# Patient Record
Sex: Female | Born: 1980
Health system: Southern US, Community
[De-identification: ages and names within clinical notes are randomized; demographics above are authoritative.]

## PROBLEM LIST (undated history)

## (undated) DIAGNOSIS — K219 Gastro-esophageal reflux disease without esophagitis: Secondary | ICD-10-CM

## (undated) HISTORY — DX: Gastro-esophageal reflux disease without esophagitis: K21.9

## (undated) HISTORY — PX: WISDOM TOOTH EXTRACTION: SHX21

---
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
## 2002-09-11 ENCOUNTER — Emergency Department (HOSPITAL_COMMUNITY): Admission: EM | Admit: 2002-09-11 | Discharge: 2002-09-11 | Payer: Self-pay | Admitting: Emergency Medicine

## 2002-09-30 ENCOUNTER — Emergency Department (HOSPITAL_COMMUNITY): Admission: EM | Admit: 2002-09-30 | Discharge: 2002-09-30 | Payer: Self-pay | Admitting: Emergency Medicine

## 2003-05-16 ENCOUNTER — Emergency Department (HOSPITAL_COMMUNITY): Admission: EM | Admit: 2003-05-16 | Discharge: 2003-05-16 | Payer: Self-pay | Admitting: Emergency Medicine

## 2003-08-02 ENCOUNTER — Ambulatory Visit (HOSPITAL_COMMUNITY): Admission: RE | Admit: 2003-08-02 | Discharge: 2003-08-02 | Payer: Self-pay | Admitting: Obstetrics

## 2003-08-11 ENCOUNTER — Emergency Department (HOSPITAL_COMMUNITY): Admission: EM | Admit: 2003-08-11 | Discharge: 2003-08-11 | Payer: Self-pay | Admitting: Emergency Medicine

## 2003-08-18 ENCOUNTER — Inpatient Hospital Stay (HOSPITAL_COMMUNITY): Admission: AD | Admit: 2003-08-18 | Discharge: 2003-08-18 | Payer: Self-pay | Admitting: Obstetrics

## 2003-08-19 ENCOUNTER — Encounter (INDEPENDENT_AMBULATORY_CARE_PROVIDER_SITE_OTHER): Payer: Self-pay | Admitting: Specialist

## 2003-08-19 ENCOUNTER — Ambulatory Visit (HOSPITAL_COMMUNITY): Admission: RE | Admit: 2003-08-19 | Discharge: 2003-08-19 | Payer: Self-pay | Admitting: Obstetrics

## 2003-11-02 ENCOUNTER — Emergency Department (HOSPITAL_COMMUNITY): Admission: EM | Admit: 2003-11-02 | Discharge: 2003-11-02 | Payer: Self-pay | Admitting: Emergency Medicine

## 2003-12-15 ENCOUNTER — Inpatient Hospital Stay (HOSPITAL_COMMUNITY): Admission: AD | Admit: 2003-12-15 | Discharge: 2003-12-15 | Payer: Self-pay | Admitting: Obstetrics

## 2004-07-15 HISTORY — PX: TUBAL LIGATION: SHX77

## 2004-12-12 ENCOUNTER — Emergency Department (HOSPITAL_COMMUNITY): Admission: EM | Admit: 2004-12-12 | Discharge: 2004-12-12 | Payer: Self-pay | Admitting: Family Medicine

## 2005-09-30 IMAGING — US US OB COMP LESS 14 WK
1 series · 18 of 24 positions shown · non-contrast
Comparison: none

CLINICAL DATA: Size less than dates.
EARLY OBSTETRICAL ULTRASOUND:
Transabdominal scanning demonstrates an intrauterine gestational sac with a mean sac diameter of 5.4 cm which would correspond to a gestational age of 10 weeks 2 days.  There is a small echogenic focus along one wall of the sac, but this is not a discrete or identifiable embryo.  There is no identifiable yolk sac.  
A 1.5 x 0.7 cm hypoechoic focus is identified adjacent to the gestational sac which may represent a small focus of subchorionic hemorrhage.  
Neither ovary could be identified.  There is no evidence for free fluid in the cul-de-sac.
IMPRESSION
No evidence for a living intrauterine pregnancy.  There is a small echogenic focus along one wall of the gestational sac which may represent a remnant of the embryo.  No yolk sac or amnion is identified.  
Probable small subchorionic hemorrhage.  
Recommend correlation with beta hCG levels.

[Series 1: us ob comp<14 wk · 18 of 24 slices shown]
[im 1/24]
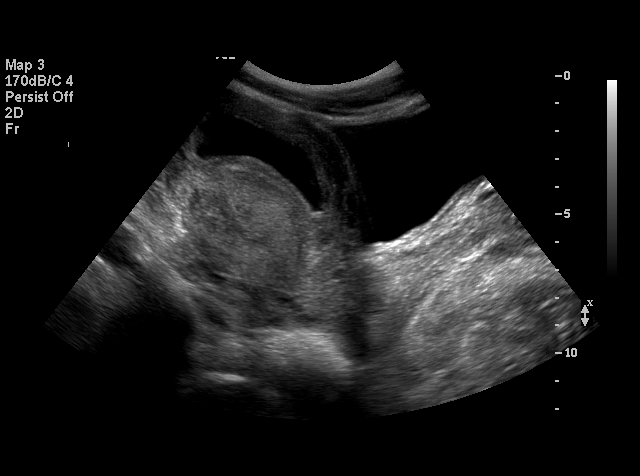
[im 3/24]
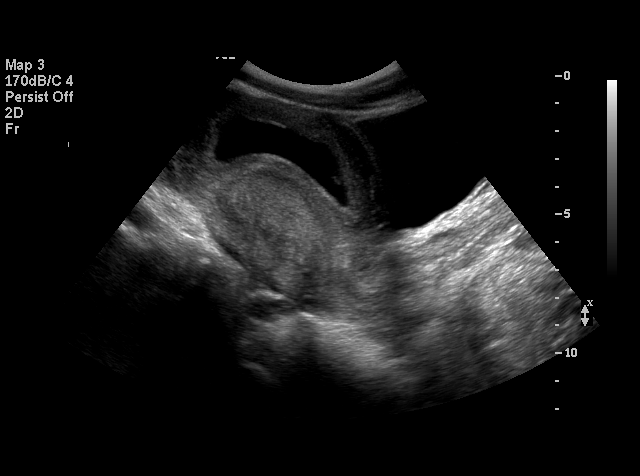
[im 4/24]
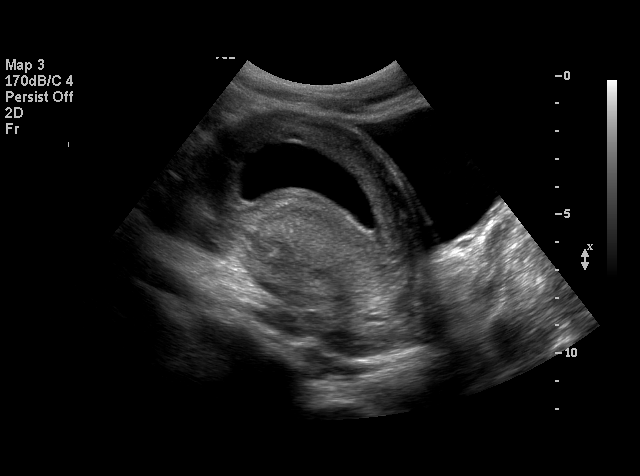
[im 5/24]
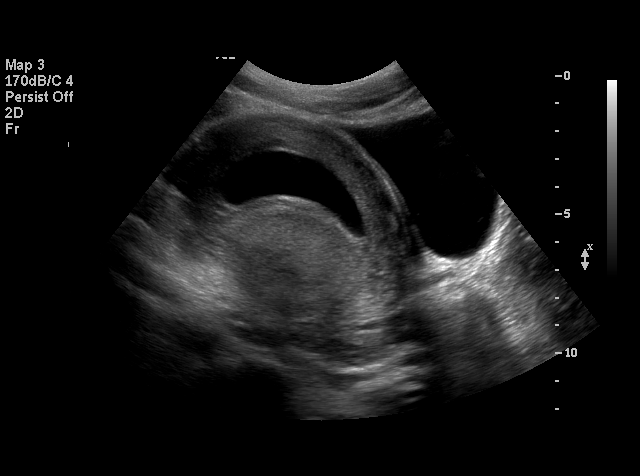
[im 7/24]
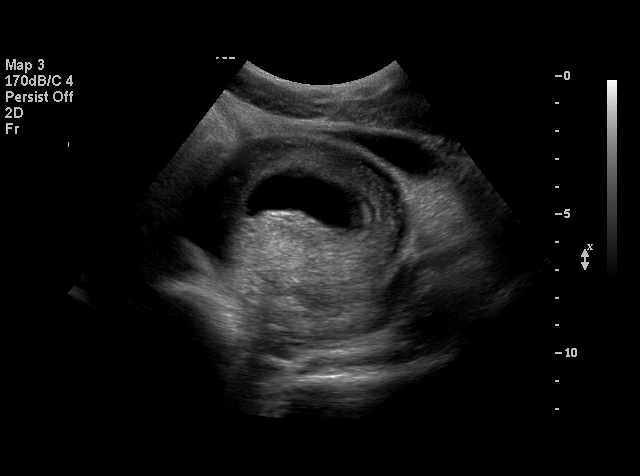
[im 8/24]
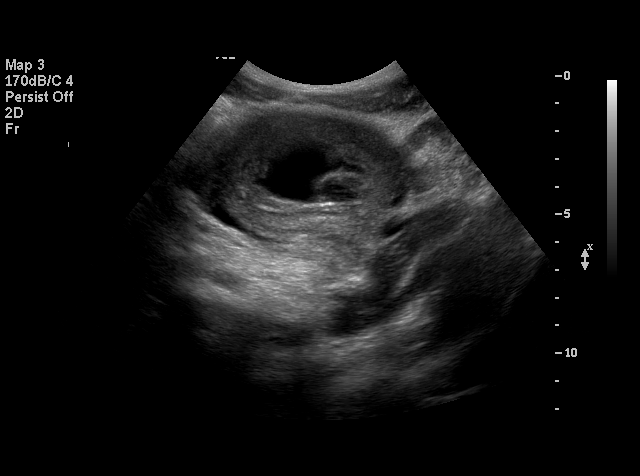
[im 9/24]
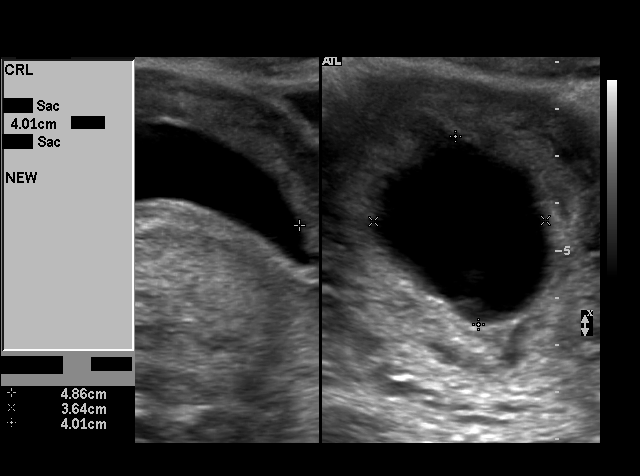
[im 11/24]
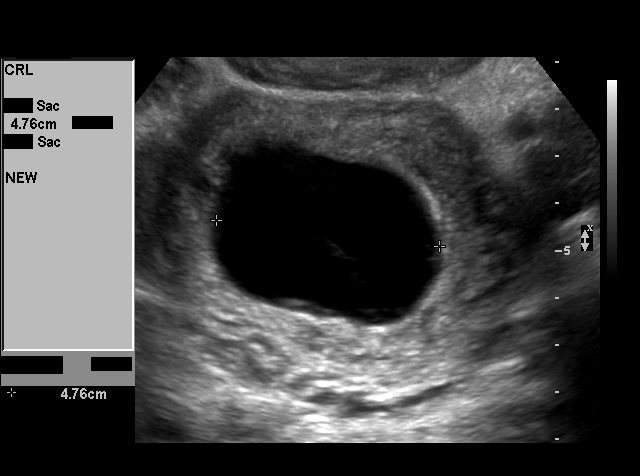
[im 12/24]
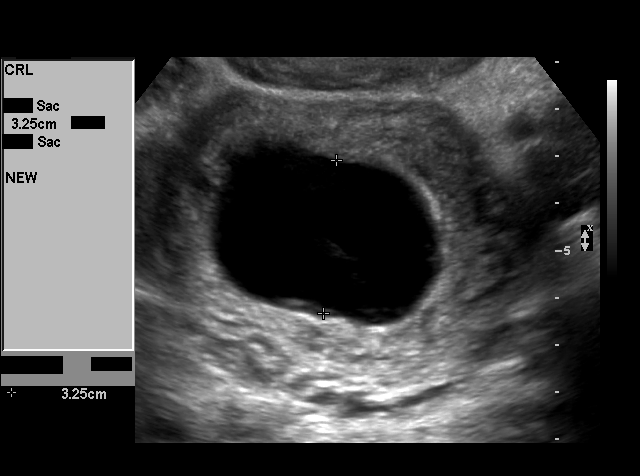
[im 13/24]
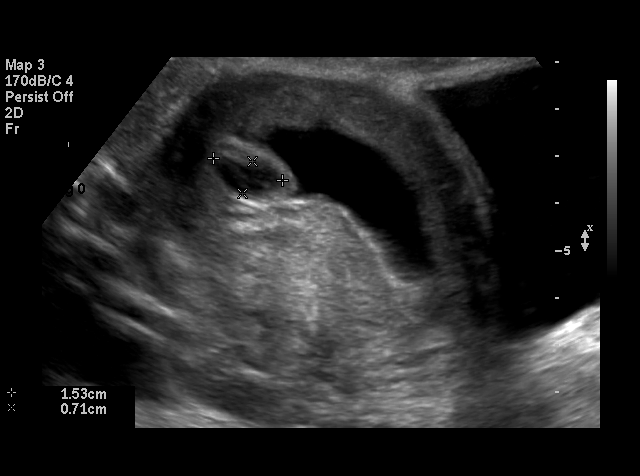
[im 15/24]
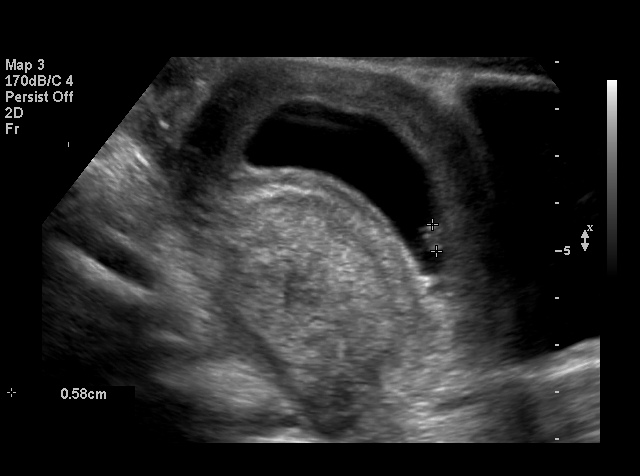
[im 16/24]
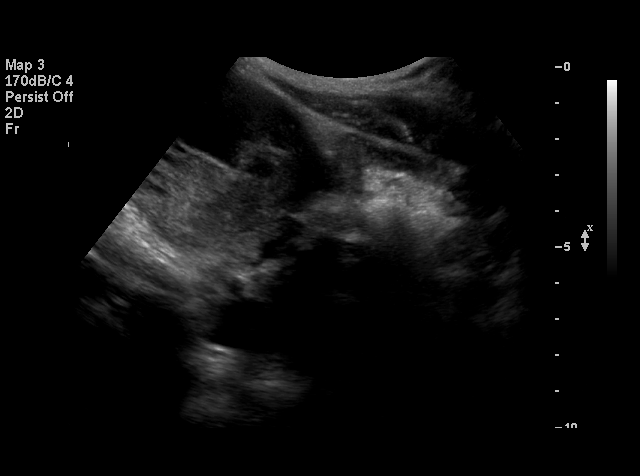
[im 17/24]
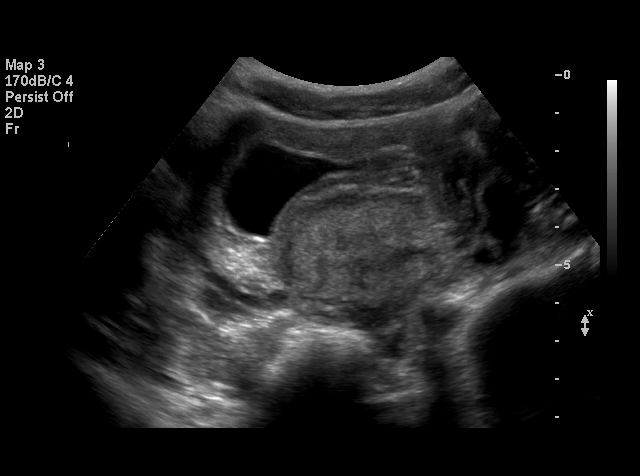
[im 19/24]
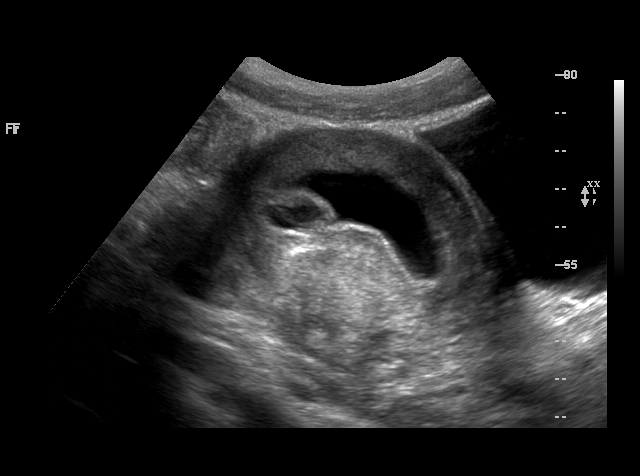
[im 20/24]
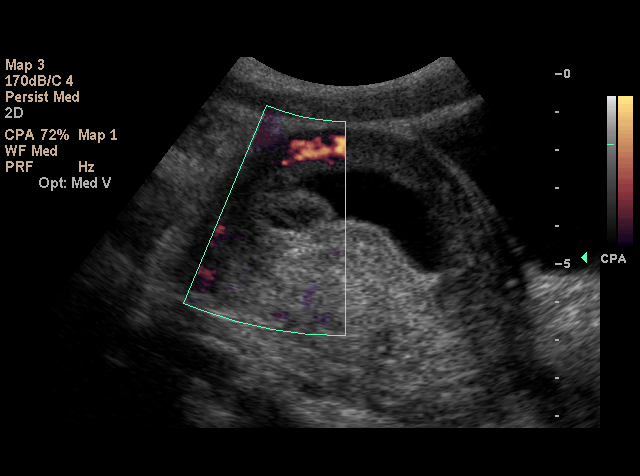
[im 21/24]
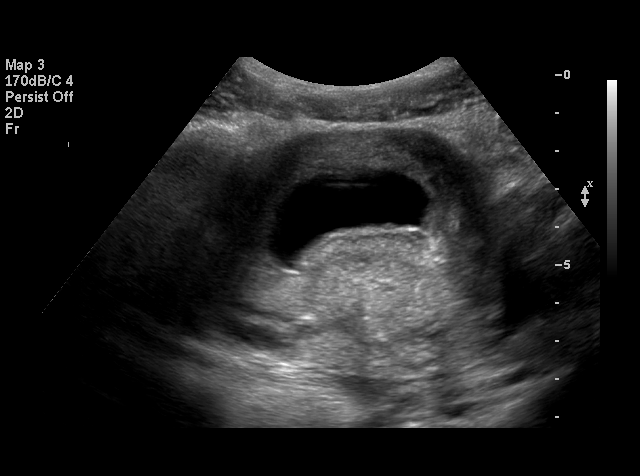
[im 23/24]
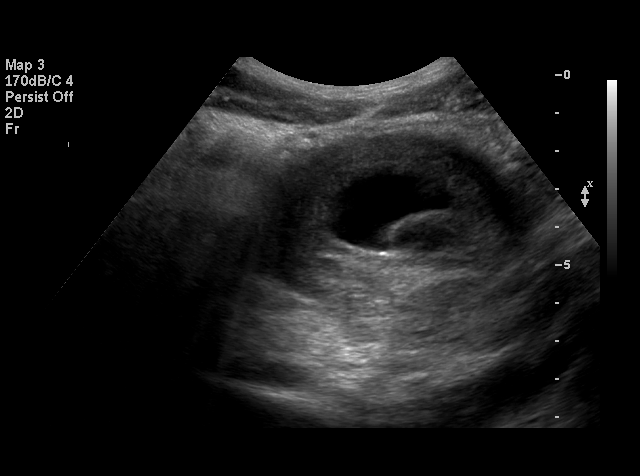
[im 24/24]
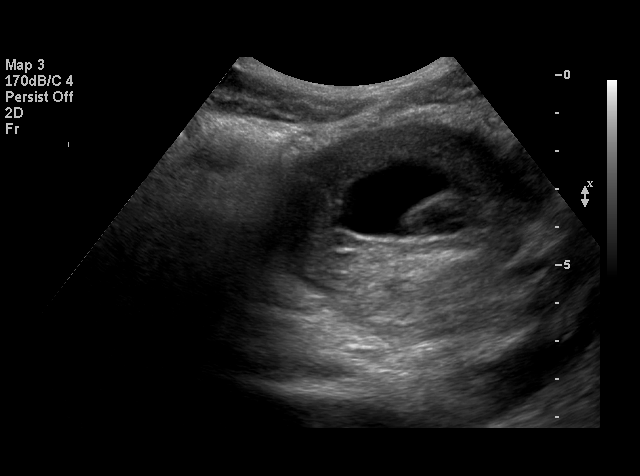

[18 of 24 positions shown; findings below may reference images not displayed]

## 2010-02-12 ENCOUNTER — Emergency Department (HOSPITAL_COMMUNITY)
Admission: EM | Admit: 2010-02-12 | Discharge: 2010-02-13 | Payer: Self-pay | Source: Home / Self Care | Admitting: Emergency Medicine

## 2010-08-02 NOTE — Op Note (Signed)
NAME:  Briana Stout, Briana Stout                         ACCOUNT NO.:  [id number]   MEDICAL RECORD NO.:  [id number]                   PATIENT TYPE:  AMB   LOCATION:  [facility]                                  FACILITY:  [facility]   PHYSICIAN:  Kathreen Cosier, M.D.           DATE OF BIRTH:  [birth year]   DATE OF PROCEDURE:  08/19/2003  DATE OF DISCHARGE:                                 OPERATIVE REPORT   PREOPERATIVE DIAGNOSES:  Intrauterine fetal demise at 11 weeks.   ANESTHESIA:  MAC.   SURGEON:  Kathreen Cosier, M.D.   DESCRIPTION OF PROCEDURE:  The patient placed on the operating room table in  the lithotomy position, after MAC was administered.  Speculum placed in the  vagina.  The anterior lip of the cervix grasped with the tenaculum.  The  cervix was injected with 10 cc of 1% Xylocaine at 3, 9 and 12 o'clock.  The  os was noted to be opened.  The uterus was 10-12 week size.   The cervix easily admitted a #10 suction.  The contents of the uterus were  aspirated and suctioned until the cavity was clean.   The patient tolerated the procedure well and was taken to the recovery room  in good condition.                                               Kathreen Cosier, M.D.    BAM/MEDQ  D:  08/19/2003  T:  08/19/2003  Job:  [id number]

## 2012-05-11 ENCOUNTER — Emergency Department (HOSPITAL_COMMUNITY)
Admission: EM | Admit: 2012-05-11 | Discharge: 2012-05-12 | Disposition: A | Discharge status: Home / Self Care | Payer: Self-pay | Attending: Emergency Medicine | Admitting: Emergency Medicine

## 2012-05-11 ENCOUNTER — Encounter (HOSPITAL_COMMUNITY): Payer: Self-pay

## 2012-05-11 DIAGNOSIS — F17200 Nicotine dependence, unspecified, uncomplicated: Secondary | ICD-10-CM | POA: Insufficient documentation

## 2012-05-11 DIAGNOSIS — K130 Diseases of lips: Secondary | ICD-10-CM | POA: Insufficient documentation

## 2012-05-11 NOTE — ED Notes (Addendum)
Patient presents with c/o a burning sensation on tongue and mouth pain x 3 weeks. Gradually getting worse and more uncomfortable. Concerned about thrush. States that she has "little white bumps and patches" in her mouth. Patient smokes about 1/2 pack of cigarettes/day. Denies smokeless tobacco or chew. Denies any fevers, chills, sweats. No recent illness.

## 2012-05-12 MED ORDER — MAGIC MOUTHWASH
5.0000 mL | Freq: Once | ORAL | Status: AC
  Administered 2012-05-12: 5 mL via ORAL
  Filled 2012-05-12: qty 5

## 2012-05-12 MED ORDER — WHITE PETROLATUM EX GEL: CUTANEOUS | Status: DC | PRN

## 2012-05-12 NOTE — ED Provider Notes (Signed)
History     CSN: [id number]  Arrival date & time 05/11/12  2326   None     Chief Complaint  Patient presents with  . Mouth Lesions    (Consider location/radiation/quality/duration/timing/severity/associated sxs/prior treatment) HPI History provided by pt.   Healthy 31yo F presents w/ 3 weeks of burning pain of her tongue, that occurs with po intake.  Yesterday she noticed white bumps on her tongue.  1 week ago, she developed painful cracks at corners of mouth, left worse than right.  Has been applying carmex w/out relief.  Denies fever and sore throat.  No known allergens or new contacts.  History reviewed. No pertinent past medical history.  Past Surgical History  Procedure Laterality Date  . Tubal ligation Bilateral 07/2004    No family history on file.  History  Substance Use Topics  . Smoking status: Current Every Day Smoker -- 0.50 packs/day    Types: Cigarettes  . Smokeless tobacco: Never Used  . Alcohol Use: Yes    OB History   Grav Para Term Preterm Abortions TAB SAB Ect Mult Living                  Review of Systems  All other systems reviewed and are negative.    Allergies  Review of patient's allergies indicates no known allergies.  Home Medications   Current Outpatient Rx  Name  Route  Sig  Dispense  Refill  . ibuprofen (ADVIL,MOTRIN) 200 MG tablet   Oral   Take 200-800 mg by mouth every 6 (six) hours as needed for pain.         . white petrolatum ointment   Topical   Apply topically as needed for dry skin.   30 g   0     BP 139/88  Pulse 57  Temp(Src) 97.5 F (36.4 C) (Oral)  Resp 16  Ht 5\' 7"  (1.702 m)  Wt 163 lb (73.936 kg)  BMI 25.52 kg/m2  SpO2 100%  LMP 05/08/2012  Physical Exam  Nursing note and vitals reviewed. Constitutional: She is oriented to person, place, and time. She appears well-developed and well-nourished. No distress.  HENT:  Head: Normocephalic and atraumatic.  Mouth/Throat: Oropharynx is clear and  moist. No oropharyngeal exudate.  Tongue with tiny, discrete, skin-colored papules.  No erythema, drainage or tenderness.  No other lesions of buccal mucosa.  Mild, diffuse gingivitis.  Dental caries.  Fissures corners of mouth, left worse than right.    Eyes:  Normal appearance  Neck: Normal range of motion.  Pulmonary/Chest: Effort normal.  Musculoskeletal: Normal range of motion.  Lymphadenopathy:    She has no cervical adenopathy.  Neurological: She is alert and oriented to person, place, and time.  Psychiatric: She has a normal mood and affect. Her behavior is normal.    ED Course  Procedures (including critical care time)  Labs Reviewed - No data to display No results found.   1. Angular cheilitis       MDM  Healthy 31yo F presents w/ burning sensation of tongue x 3 weeks w/ papular tongue lesions x 1d as well as angular cheilitis.  Pt received miracle mouthwash in ED and was prescribed petroleum ointment for fissures.  Do not suspect bacterial/fungal infection or STD.  Referred to ENT for further evaluation.  Return precautions discussed.         Otilio Miu, PA-C 05/12/12 [phone]

## 2012-05-13 NOTE — ED Provider Notes (Signed)
Medical screening examination/treatment/procedure(s) were performed by non-physician practitioner and as supervising physician I was immediately available for consultation/collaboration.   Laurna Shetley M Evanee Lubrano, MD 05/13/12 0346 

## 2014-11-27 LAB — PROCEDURE REPORT - SCANNED: PAP SMEAR: NEGATIVE

## 2015-04-05 ENCOUNTER — Emergency Department (HOSPITAL_COMMUNITY): Payer: Self-pay

## 2015-04-05 ENCOUNTER — Encounter (HOSPITAL_COMMUNITY): Payer: Self-pay | Admitting: Emergency Medicine

## 2015-04-05 ENCOUNTER — Emergency Department (HOSPITAL_COMMUNITY)
Admission: EM | Admit: 2015-04-05 | Discharge: 2015-04-05 | Disposition: A | Discharge status: Home / Self Care | Payer: Medicaid Other | Attending: Emergency Medicine | Admitting: Emergency Medicine

## 2015-04-05 DIAGNOSIS — S63502A Unspecified sprain of left wrist, initial encounter: Secondary | ICD-10-CM

## 2015-04-05 DIAGNOSIS — F17210 Nicotine dependence, cigarettes, uncomplicated: Secondary | ICD-10-CM | POA: Insufficient documentation

## 2015-04-05 DIAGNOSIS — X58XXXA Exposure to other specified factors, initial encounter: Secondary | ICD-10-CM | POA: Insufficient documentation

## 2015-04-05 DIAGNOSIS — Y990 Civilian activity done for income or pay: Secondary | ICD-10-CM | POA: Insufficient documentation

## 2015-04-05 DIAGNOSIS — Y9389 Activity, other specified: Secondary | ICD-10-CM | POA: Insufficient documentation

## 2015-04-05 DIAGNOSIS — Y9289 Other specified places as the place of occurrence of the external cause: Secondary | ICD-10-CM | POA: Insufficient documentation

## 2015-04-05 MED ORDER — DICLOFENAC SODIUM 50 MG PO TBEC: 50.0000 mg | DELAYED_RELEASE_TABLET | Freq: Two times a day (BID) | ORAL | Status: DC

## 2015-04-05 NOTE — ED Notes (Signed)
Wrist splint applied to left wrist.

## 2015-04-05 NOTE — ED Provider Notes (Signed)
CSN: [id number]     Arrival date & time 04/05/15  1223 History  By signing my name below, I, Tanda Rockers, attest that this documentation has been prepared under the direction and in the presence of Langston Masker, [place].  Electronically Signed: Tanda Rockers, ED Scribe. 04/05/2015. 1:08 PM.   Chief Complaint  Patient presents with  . Wrist Pain   The history is provided by the patient. No language interpreter was used.     HPI Comments: Briana Stout is a 35 y.o. female who presents to the Emergency Department complaining of sudden onset, constant, left wrist pain x 2 weeks, worsening today. 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 She has been taking Ibuprofen and applying ice for the past 2 weeks with some relief but reports that her pain worsened today while at work, prompting her to come to the ED. She denies weakness, numbness, tingling, or any other associated symptoms.   History reviewed. No pertinent past medical history. Past Surgical History  Procedure Laterality Date  . Tubal ligation Bilateral 07/2004   History reviewed. No pertinent family history. Social History  Substance Use Topics  . Smoking status: Current Every Day Smoker -- 0.50 packs/day    Types: Cigarettes  . Smokeless tobacco: Never Used  . Alcohol Use: Yes   OB History    No data available     Review of Systems  Musculoskeletal: Positive for arthralgias (Left wrist).  Neurological: Negative for weakness and numbness.  All other systems reviewed and are negative.  Allergies  Review of patient's allergies indicates no known allergies.  Home Medications   Prior to Admission medications   Medication Sig Start Date End Date Taking? Authorizing Provider  ibuprofen (ADVIL,MOTRIN) 200 MG tablet Take 200-800 mg by mouth every 6 (six) hours as needed for pain.   Yes Historical Provider, MD   Triage Vitals:  BP 131/97 mmHg   Pulse 100  Temp(Src) 98.7 F (37.1 C) (Oral)  Resp 18  SpO2 100%   Physical Exam  Constitutional: She is oriented to person, place, and time. She appears well-developed and well-nourished. No distress.  HENT:  Head: Normocephalic and atraumatic.  Eyes: Conjunctivae and EOM are normal.  Neck: Neck supple. No tracheal deviation present.  Cardiovascular: Normal rate.   Pulmonary/Chest: Effort normal. No respiratory distress.  Musculoskeletal: Normal range of motion. She exhibits tenderness.  Tenderness and swelling to left wrist  Neurological: She is alert and oriented to person, place, and time.  Skin: Skin is warm and dry.  Psychiatric: She has a normal mood and affect. Her behavior is normal.  Nursing note and vitals reviewed.   ED Course  Procedures (including critical care time)  DIAGNOSTIC STUDIES: Oxygen Saturation is 100% on RA, normal by my interpretation.    COORDINATION OF CARE: 1:08 PM-Discussed treatment plan which includes DG L Wrist with pt at bedside and pt agreed to plan.   Labs Review Labs Reviewed - No data to display  Imaging Review Dg Wrist Complete Left  04/05/2015  CLINICAL DATA:  Progressive pain past 2 weeks. No focal trauma but job requiring repetitive motion EXAM: LEFT WRIST - COMPLETE 3+ VIEW COMPARISON:  None. FINDINGS: Frontal, oblique, lateral, and ulnar deviation scaphoid images were obtained. No acute fracture or dislocation. A small focus of calcification just distal to the ulna in the ulnocarpal joint proximally may represent residua of  old trauma. There is no appreciable joint space narrowing. No erosive change. IMPRESSION: Small focus of calcification just distal to the ulna in proximal ulnocarpal joint may represent residua of old trauma. No acute fracture or dislocation. No appreciable arthropathic change. Electronically Signed   By: Bretta Bang III M.D.   On: 04/05/2015 13:28     EKG Interpretation None      MDM   Final  diagnoses:  Wrist sprain, left, initial encounter   Meds ordered this encounter  Medications  . diclofenac (VOLTAREN) 50 MG EC tablet    Sig: Take 1 tablet (50 mg total) by mouth 2 (two) times daily.    Dispense:  20 tablet    Refill:  0    Order Specific Question:  Supervising Provider    Answer:  Genella Rife   AVS Wrist splint  An After Visit Summary was printed and given to the patient.     Elson Areas, PA-C 04/05/15 1342  Lonia Skinner Gila, PA-C 04/05/15 1343  Jerelyn Scott, MD 04/05/15 1357

## 2015-04-05 NOTE — Discharge Instructions (Signed)
Wrist Pain There are many things that can cause wrist pain. Some common causes include:  An injury to the wrist area, such as a sprain, strain, or fracture.  Overuse of the joint.  A condition that causes increased pressure on a nerve in the wrist (carpal tunnel syndrome).  Wear and tear of the joints that occurs with aging (osteoarthritis).  A variety of other types of arthritis. Sometimes, the cause of wrist pain is not known. The pain often goes away when you follow your health care provider's instructions for relieving pain at home. If your wrist pain continues, tests may need to be done to diagnose your condition. HOME CARE INSTRUCTIONS Pay attention to any changes in your symptoms. Take these actions to help with your pain:  Rest the wrist area for at least 48 hours or as told by your health care provider.  If directed, apply ice to the injured area:  Put ice in a plastic bag.  Place a towel between your skin and the bag.  Leave the ice on for 20 minutes, 2-3 times per day.  Keep your arm raised (elevated) above the level of your heart while you are sitting or lying down.  If a splint or elastic bandage has been applied, use it as told by your health care provider.  Remove the splint or bandage only as told by your health care provider.  Loosen the splint or bandage if your fingers become numb or have a tingling feeling, or if they turn cold or blue.  Take over-the-counter and prescription medicines only as told by your health care provider.  Keep all follow-up visits as told by your health care provider. This is important. SEEK MEDICAL CARE IF:  Your pain is not helped by treatment.  Your pain gets worse. SEEK IMMEDIATE MEDICAL CARE IF:  Your fingers become swollen.  Your fingers turn white, very red, or cold and blue.  Your fingers are numb or have a tingling feeling.  You have difficulty moving your fingers.   This information is not intended to replace  advice given to you by your health care provider. Make sure you discuss any questions you have with your health care provider.   Document Released: 12/11/2004 Document Revised: 11/22/2014 Document Reviewed: 07/19/2014 Elsevier Interactive Patient Education 2016 [employer]  

## 2015-04-05 NOTE — ED Notes (Signed)
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 Today unable to go to work d/t pain. States when it happened felt a "pop, then a tearing feeling." Mild swelling to left wrist.

## 2015-12-12 ENCOUNTER — Encounter: Payer: Self-pay | Admitting: *Deleted

## 2016-06-09 ENCOUNTER — Encounter: Payer: Self-pay | Admitting: Adult Health

## 2016-06-09 ENCOUNTER — Ambulatory Visit (INDEPENDENT_AMBULATORY_CARE_PROVIDER_SITE_OTHER): Payer: No Typology Code available for payment source | Admitting: Adult Health

## 2016-06-09 VITALS — BP 128/83 | HR 89 | Ht 66.5 in | Wt 152.5 lb

## 2016-06-09 DIAGNOSIS — R5383 Other fatigue: Secondary | ICD-10-CM | POA: Diagnosis not present

## 2016-06-09 DIAGNOSIS — Z8249 Family history of ischemic heart disease and other diseases of the circulatory system: Secondary | ICD-10-CM | POA: Diagnosis not present

## 2016-06-09 DIAGNOSIS — Z0000 Encounter for general adult medical examination without abnormal findings: Secondary | ICD-10-CM | POA: Insufficient documentation

## 2016-06-09 DIAGNOSIS — Z833 Family history of diabetes mellitus: Secondary | ICD-10-CM | POA: Diagnosis not present

## 2016-06-09 DIAGNOSIS — Z720 Tobacco use: Secondary | ICD-10-CM

## 2016-06-09 MED ORDER — BUPROPION HCL ER (SR) 150 MG PO TB12: 150.0000 mg | ORAL_TABLET | Freq: Two times a day (BID) | ORAL | 2 refills | Status: AC

## 2016-06-09 MED ORDER — MELOXICAM 7.5 MG PO TABS: 7.5000 mg | ORAL_TABLET | Freq: Every day | ORAL | 0 refills | Status: AC

## 2016-06-09 NOTE — Progress Notes (Signed)
Subjective:    Patient ID: Briana Stout, female    DOB: 03-Feb-1981, 36 y.o.   MRN: [id number]  HPI:  Ms. Briana Stout presents to establish as a new pt. She is very pleasant 36 year old female.  PMH:  GERD, tobacco use, intermittent anxiety, left wrist pain (4/10-10/10 that worsens throughout day.  Pain began > 12 months ago while at work-being followed by [employer] provider.  She uses OTC Ibuprofen and wrist splint-offers only minimal pain relief).  She works Teacher, English as a foreign language at [employer] and is attending college FT (Health Information Management).  She has two children ages 11 and 72.  She is ready to quit smoking and has been using patches, "but they really don't help".   Sig family hx:  DM, CAD, CVA.  Her father passed awat at age 10 from stroke.  He smoked heavily and used illicit drugs regularly.  She reports having intermittent palpitations, however believes it may be r/t stress.  She had break-up last year and it was a 3 year relationship.  She becomes a little tearful when speaking about it. She denies thoughts of harming herself/others or acute depression.   Patient Care Team    Relationship Specialty Notifications Start End  Provider Default, MD PCP - General   05/12/12    Comment: NO PCP    Patient Active Problem List   Diagnosis Date Noted  . Routine physical examination 06/09/2016  . Family history of heart disease 06/09/2016  . Other fatigue 06/09/2016  . Family history of diabetes mellitus 06/09/2016  . Tobacco use 06/09/2016     Past Medical History:  Diagnosis Date  . GERD (gastroesophageal reflux disease)      Past Surgical History:  Procedure Laterality Date  . TUBAL LIGATION Bilateral 07/2004  . WISDOM TOOTH EXTRACTION       Family History  Problem Relation Age of Onset  . Cancer Mother     lung- in remission  . Heart attack Father     mid 30's  . Early death Father   . Healthy Sister   . Healthy Brother   . Healthy Daughter   . Healthy Son   . Healthy Sister    . Healthy Sister      History  Drug Use No     History  Alcohol Use  . Yes     History  Smoking Status  . Current Every Day Smoker  . Packs/day: 0.50  . Years: 15.00  . Types: Cigarettes  Smokeless Tobacco  . Never Used     Outpatient Encounter Prescriptions as of 06/09/2016  Medication Sig  . ibuprofen (ADVIL,MOTRIN) 200 MG tablet Take 200-800 mg by mouth every 6 (six) hours as needed for pain.  Marland Kitchen buPROPion (WELLBUTRIN SR) 150 MG 12 hr tablet Take 1 tablet (150 mg total) by mouth 2 (two) times daily. First three days please take just one tablet, then increase to two tablets daily (every 12 hrs).  . meloxicam (MOBIC) 7.5 MG tablet Take 1 tablet (7.5 mg total) by mouth daily. May take at bedtime is needed for more pain control.  . [DISCONTINUED] diclofenac (VOLTAREN) 50 MG EC tablet Take 1 tablet (50 mg total) by mouth 2 (two) times daily.   No facility-administered encounter medications on file as of 06/09/2016.     Allergies: Patient has no known allergies.  Body mass index is 24.25 kg/m.  Blood pressure 128/83, pulse 89, height 5' 6.5" (1.689 m), weight 152 lb 8 oz (69.2  kg), last menstrual period 06/05/2016.     Review of Systems  Constitutional: Negative for activity change, appetite change, chills, diaphoresis, fatigue, fever and unexpected weight change.  HENT: Negative for congestion.   Eyes: Negative for visual disturbance.  Respiratory: Negative for cough, chest tightness, shortness of breath, wheezing and stridor.   Cardiovascular: Positive for palpitations. Negative for chest pain and leg swelling.       Very infrequent palpitations that self resolve.  She denies angina.  Gastrointestinal: Positive for constipation. Negative for abdominal distention, abdominal pain, blood in stool, diarrhea, nausea and vomiting.  Endocrine: Negative for cold intolerance, heat intolerance, polydipsia, polyphagia and polyuria.  Genitourinary: Negative for difficulty  urinating, flank pain and hematuria.  Musculoskeletal: Positive for arthralgias. Negative for joint swelling.       Left wrist pain: 4/10-10/10 occurs everyday and worsens throughout the day. Pain is dull-sharp and radiates to left elbow. She is right hand dominate.  Skin: Negative for color change, pallor, rash and wound.  Allergic/Immunologic: Negative for immunocompromised state.  Neurological: Negative for dizziness, tremors, weakness, light-headedness and headaches.  Psychiatric/Behavioral: Negative for agitation, behavioral problems, confusion, decreased concentration, dysphoric mood, hallucinations, self-injury, sleep disturbance and suicidal ideas. The patient is not nervous/anxious and is not hyperactive.        Objective:   Physical Exam  Constitutional: She is oriented to person, place, and time. She appears well-developed and well-nourished. No distress.  HENT:  Head: Normocephalic and atraumatic.  Right Ear: External ear normal.  Left Ear: External ear normal.  Eyes: Conjunctivae are normal. Pupils are equal, round, and reactive to light.  Neck: Normal range of motion.  Cardiovascular: Normal rate, regular rhythm, normal heart sounds and intact distal pulses.   No murmur heard. Pulmonary/Chest: Effort normal and breath sounds normal. No respiratory distress. She has no wheezes. She has no rales. She exhibits no tenderness.  Abdominal: Soft. Bowel sounds are normal. She exhibits no distension and no mass. There is no tenderness. There is no rebound and no guarding.  Musculoskeletal: Normal range of motion. She exhibits tenderness. She exhibits no edema.       Left wrist: She exhibits tenderness. She exhibits normal range of motion, no swelling, no crepitus and no deformity.  Lymphadenopathy:    She has no cervical adenopathy.  Neurological: She is alert and oriented to person, place, and time.  Skin: Skin is warm and dry. No rash noted. She is not diaphoretic. No erythema.  No pallor.  Psychiatric: She has a normal mood and affect. Her behavior is normal. Judgment and thought content normal.  Nursing note and vitals reviewed.         Assessment & Plan:   1. Routine physical examination   2. Family history of heart disease   3. Other fatigue   4. Family history of diabetes mellitus   5. Tobacco use     Routine physical examination Heart Healthy Diet Increase regular movement to at least several times daily. Tobacco Cessation-started on Wellbutrin. Please schedule fasting lab appt at your convenience.   Family history of heart disease EKG today-NSR  Other fatigue Increase water intake to at least 75 ounces daily. Practice Sleep Hygiene. Will obtain Vit d and TSH levels.   Family history of diabetes mellitus Will obtain A1c.  Tobacco use Wellbutrin 150mg -start with daily dose for 3 days, then increase to BID on day four. Start reducing tobacco in 1.5 weeks after starting medication.    FOLLOW-UP:  Return in  about 1 month (around 07/10/2016) for Evaluate Medication Effectiveness.

## 2016-06-09 NOTE — Patient Instructions (Signed)
Heart-Healthy Eating Plan Many factors influence your heart health, including eating and exercise habits. Heart (coronary) risk increases with abnormal blood fat (lipid) levels. Heart-healthy meal planning includes limiting unhealthy fats, increasing healthy fats, and making other small dietary changes. This includes maintaining a healthy body weight to help keep lipid levels within a normal range. What is my plan? Your health care provider recommends that you:  Get no more than _________% of the total calories in your daily diet from fat.  Limit your intake of saturated fat to less than _________% of your total calories each day.  Limit the amount of cholesterol in your diet to less than _________ mg per day. What types of fat should I choose?  Choose healthy fats more often. Choose monounsaturated and polyunsaturated fats, such as olive oil and canola oil, flaxseeds, walnuts, almonds, and seeds.  Eat more omega-3 fats. Good choices include salmon, mackerel, sardines, tuna, flaxseed oil, and ground flaxseeds. Aim to eat fish at least two times each week.  Limit saturated fats. Saturated fats are primarily found in animal products, such as meats, butter, and cream. Plant sources of saturated fats include palm oil, palm kernel oil, and coconut oil.  Avoid foods with partially hydrogenated oils in them. These contain trans fats. Examples of foods that contain trans fats are stick margarine, some tub margarines, cookies, crackers, and other baked goods. What general guidelines do I need to follow?  Check food labels carefully to identify foods with trans fats or high amounts of saturated fat.  Fill one half of your plate with vegetables and green salads. Eat 4-5 servings of vegetables per day. A serving of vegetables equals 1 cup of raw leafy vegetables,  cup of raw or cooked cut-up vegetables, or  cup of vegetable juice.  Fill one fourth of your plate with whole grains. Look for the word  "whole" as the first word in the ingredient list.  Fill one fourth of your plate with lean protein foods.  Eat 4-5 servings of fruit per day. A serving of fruit equals one medium whole fruit,  cup of dried fruit,  cup of fresh, frozen, or canned fruit, or  cup of 100% fruit juice.  Eat more foods that contain soluble fiber. Examples of foods that contain this type of fiber are apples, broccoli, carrots, beans, peas, and barley. Aim to get 20-30 g of fiber per day.  Eat more home-cooked food and less restaurant, buffet, and fast food.  Limit or avoid alcohol.  Limit foods that are high in starch and sugar.  Avoid fried foods.  Cook foods by using methods other than frying. Baking, boiling, grilling, and broiling are all great options. Other fat-reducing suggestions include:  Removing the skin from poultry.  Removing all visible fats from meats.  Skimming the fat off of stews, soups, and gravies before serving them.  Steaming vegetables in water or broth.  Lose weight if you are overweight. Losing just 5-10% of your initial body weight can help your overall health and prevent diseases such as diabetes and heart disease.  Increase your consumption of nuts, legumes, and seeds to 4-5 servings per week. One serving of dried beans or legumes equals  cup after being cooked, one serving of nuts equals 1 ounces, and one serving of seeds equals  ounce or 1 tablespoon.  You may need to monitor your salt (sodium) intake, especially if you have high blood pressure. Talk with your health care provider or dietitian to get more  information about reducing sodium. What foods can I eat? Grains   Breads, including [place], white, pita, wheat, raisin, rye, oatmeal, and [place]. Tortillas that are neither fried nor made with lard or trans fat. Low-fat rolls, including hotdog and hamburger buns and English muffins. Biscuits. Muffins. Waffles. Pancakes. Light popcorn. Whole-grain cereals. Flatbread.  Melba toast. Pretzels. Breadsticks. Rusks. Low-fat snacks and crackers, including oyster, saltine, matzo, graham, animal, and rye. Rice and pasta, including [place] rice and those that are made with whole wheat. Vegetables  All vegetables. Fruits  All fruits, but limit coconut. Meats and Other Protein Sources  Lean, well-trimmed beef, veal, pork, and lamb. Chicken and [place] without skin. All fish and shellfish. Wild duck, rabbit, pheasant, and venison. Egg whites or low-cholesterol egg substitutes. Dried beans, peas, lentils, and tofu.Seeds and most nuts. Dairy  Low-fat or nonfat cheeses, including ricotta, string, and mozzarella. Skim or 1% milk that is liquid, powdered, or evaporated. Buttermilk that is made with low-fat milk. Nonfat or low-fat yogurt. Beverages  Mineral water. Diet carbonated beverages. Sweets and Desserts  Sherbets and fruit ices. Honey, jam, marmalade, jelly, and syrups. Meringues and gelatins. Pure sugar candy, such as hard candy, jelly beans, gumdrops, mints, marshmallows, and small amounts of dark chocolate. [employer] Eat all sweets and desserts in moderation. Fats and Oils  Nonhydrogenated (trans-free) margarines. Vegetable oils, including soybean, sesame, sunflower, olive, peanut, safflower, corn, canola, and cottonseed. Salad dressings or mayonnaise that are made with a vegetable oil. Limit added fats and oils that you use for cooking, baking, salads, and as spreads. Other  Cocoa powder. Coffee and tea. All seasonings and condiments. The items listed above may not be a complete list of recommended foods or beverages. Contact your dietitian for more options.  What foods are not recommended? Grains  Breads that are made with saturated or trans fats, oils, or whole milk. Croissants. Butter rolls. Cheese breads. Sweet rolls. Donuts. Buttered popcorn. Chow mein noodles. High-fat crackers, such as cheese or butter crackers. Meats and Other Protein Sources  Fatty  meats, such as hotdogs, short ribs, sausage, spareribs, bacon, ribeye roast or steak, and mutton. High-fat deli meats, such as salami and bologna. Caviar. Domestic duck and goose. Organ meats, such as kidney, liver, sweetbreads, brains, gizzard, chitterlings, and heart. Dairy  Cream, sour cream, cream cheese, and creamed cottage cheese. Whole milk cheeses, including blue (bleu), Monte[place] Jack, [place], [place], American, [place], Swiss, [place], [place], and [place]. Whole or 2% milk that is liquid, evaporated, or condensed. Whole buttermilk. Cream sauce or high-fat cheese sauce. Yogurt that is made from whole milk. Beverages  Regular sodas and drinks with added sugar. Sweets and Desserts  Frosting. Pudding. Cookies. Cakes other than angel food cake. Candy that has milk chocolate or white chocolate, hydrogenated fat, butter, coconut, or unknown ingredients. Buttered syrups. Full-fat ice cream or ice cream drinks. Fats and Oils  Gravy that has suet, meat fat, or shortening. Cocoa butter, hydrogenated oils, palm oil, coconut oil, palm kernel oil. These can often be found in baked products, candy, fried foods, nondairy creamers, and whipped toppings. Solid fats and shortenings, including bacon fat, salt pork, lard, and butter. Nondairy cream substitutes, such as coffee creamers and sour cream substitutes. Salad dressings that are made of unknown oils, cheese, or sour cream. The items listed above may not be a complete list of foods and beverages to avoid. Contact your dietitian for more information.  This information is not intended to replace advice given to you by  your health care provider. Make sure you discuss any questions you have with your health care provider. Document Released: 12/11/2007 Document Revised: 09/21/2015 Document Reviewed: 08/25/2013 Elsevier Interactive Patient Education  2017 Elsevier Inc.  Bupropion sustained-release tablets (smoking cessation) What is this medicine? BUPROPION  (byoo PROE pee on) is used to help people quit smoking. This medicine may be used for other purposes; ask your health care provider or pharmacist if you have questions. COMMON BRAND NAME(S): Buproban, Zyban What should I tell my health care provider before I take this medicine? They need to know if you have any of these conditions: -an eating disorder, such as anorexia or bulimia -bipolar disorder or psychosis -diabetes or high blood sugar, treated with medication -glaucoma -head injury or brain tumor -heart disease, previous heart attack, or irregular heart beat -high blood pressure -kidney or liver disease -seizures -suicidal thoughts or a previous suicide attempt -Tourette's syndrome -weight loss -an unusual or allergic reaction to bupropion, other medicines, foods, dyes, or preservatives -breast-feeding -pregnant or trying to become pregnant How should I use this medicine? Take this medicine by mouth with a glass of water. Follow the directions on the prescription label. You can take it with or without food. If it upsets your stomach, take it with food. Do not cut, crush or chew this medicine. Take your medicine at regular intervals. If you take this medicine more than once a day, take your second dose at least 8 hours after you take your first dose. To limit difficulty in sleeping, avoid taking this medicine at bedtime. Do not take your medicine more often than directed. Do not stop taking this medicine suddenly except upon the advice of your doctor. Stopping this medicine too quickly may cause serious side effects. A special MedGuide will be given to you by the pharmacist with each prescription and refill. Be sure to read this information carefully each time. Talk to your pediatrician regarding the use of this medicine in children. Special care may be needed. Overdosage: If you think you have taken too much of this medicine contact a poison control center or emergency room at  once. NOTE: This medicine is only for you. Do not share this medicine with others. What if I miss a dose? If you miss a dose, skip the missed dose and take your next tablet at the regular time. There should be at least 8 hours between doses. Do not take double or extra doses. What may interact with this medicine? Do not take this medicine with any of the following medications: -linezolid -MAOIs like Azilect, Carbex, Eldepryl, Marplan, Nardil, and Parnate -methylene blue (injected into a vein) -other medicines that contain bupropion like Wellbutrin This medicine may also interact with the following medications: -alcohol -certain medicines for anxiety or sleep -certain medicines for blood pressure like metoprolol, propranolol -certain medicines for depression or psychotic disturbances -certain medicines for HIV or AIDS like efavirenz, lopinavir, nelfinavir, ritonavir -certain medicines for irregular heart beat like propafenone, flecainide -certain medicines for Parkinson's disease like amantadine, levodopa -certain medicines for seizures like carbamazepine, phenytoin, phenobarbital -cimetidine -clopidogrel -cyclophosphamide -digoxin -furazolidone -isoniazid -nicotine -orphenadrine -procarbazine -steroid medicines like prednisone or cortisone -stimulant medicines for attention disorders, weight loss, or to stay awake -tamoxifen -theophylline -thiotepa -ticlopidine -tramadol -warfarin This list may not describe all possible interactions. Give your health care provider a list of all the medicines, herbs, non-prescription drugs, or dietary supplements you use. Also tell them if you smoke, drink alcohol, or use illegal drugs. Some items  may interact with your medicine. What should I watch for while using this medicine? Visit your doctor or health care professional for regular checks on your progress. This medicine should be used together with a patient support program. It is important  to participate in a behavioral program, counseling, or other support program that is recommended by your health care professional. Patients and their families should watch out for new or worsening thoughts of suicide or depression. Also watch out for sudden changes in feelings such as feeling anxious, agitated, panicky, irritable, hostile, aggressive, impulsive, severely restless, overly excited and hyperactive, or not being able to sleep. If this happens, especially at the beginning of treatment or after a change in dose, call your health care professional. Avoid alcoholic drinks while taking this medicine. Drinking excessive alcoholic beverages, using sleeping or anxiety medicines, or quickly stopping the use of these agents while taking this medicine may increase your risk for a seizure. Do not drive or use heavy machinery until you know how this medicine affects you. This medicine can impair your ability to perform these tasks. Do not take this medicine close to bedtime. It may prevent you from sleeping. Your mouth may get dry. Chewing sugarless gum or sucking hard candy, and drinking plenty of water may help. Contact your doctor if the problem does not go away or is severe. Do not use nicotine patches or chewing gum without the advice of your doctor or health care professional while taking this medicine. You may need to have your blood pressure taken regularly if your doctor recommends that you use both nicotine and this medicine together. What side effects may I notice from receiving this medicine? Side effects that you should report to your doctor or health care professional as soon as possible: -allergic reactions like skin rash, itching or hives, swelling of the face, lips, or tongue -breathing problems -changes in vision -confusion -elevated mood, decreased need for sleep, racing thoughts, impulsive behavior -fast or irregular heartbeat -hallucinations, loss of contact with  reality -increased blood pressure -redness, blistering, peeling or loosening of the skin, including inside the mouth -seizures -suicidal thoughts or other mood changes -unusually weak or tired -vomiting Side effects that usually do not require medical attention (report to your doctor or health care professional if they continue or are bothersome): -constipation -headache -loss of appetite -nausea -tremors -weight loss This list may not describe all possible side effects. Call your doctor for medical advice about side effects. You may report side effects to FDA at 1-800-FDA-1088. Where should I keep my medicine? Keep out of the reach of children. Store at room temperature between 20 and 25 degrees C (68 and 77 degrees F). Protect from light. Keep container tightly closed. Throw away any unused medicine after the expiration date. NOTE: This sheet is a summary. It may not cover all possible information. If you have questions about this medicine, talk to your doctor, pharmacist, or health care provider.  2018 Elsevier/Gold Standard (2015-08-24 13:49:28)  Please schedule fasting lab nurse visit at your convenience. Please return in 1 month to evaluate effectiveness of Wellbutrin for smoking cessation. Continue use of left wrist brace, esp. When working. Apply ice to left wrist for 20 mins, several times daily. Follow-up with Worker's Comp coordinator to request referral to Orthopedic Specialist.

## 2016-06-09 NOTE — Assessment & Plan Note (Signed)
Increase water intake to at least 75 ounces daily. Practice Sleep Hygiene. Will obtain Vit d and TSH levels.

## 2016-06-09 NOTE — Assessment & Plan Note (Signed)
Wellbutrin 150mg -start with daily dose for 3 days, then increase to BID on day four. Start reducing tobacco in 1.5 weeks after starting medication.

## 2016-06-09 NOTE — Assessment & Plan Note (Signed)
EKG today NSR.

## 2016-06-09 NOTE — Assessment & Plan Note (Signed)
Heart Healthy Diet Increase regular movement to at least several times daily. Tobacco Cessation-started on Wellbutrin. Please schedule fasting lab appt at your convenience.

## 2016-06-09 NOTE — Assessment & Plan Note (Signed)
Will obtain A1c.  

## 2016-06-17 ENCOUNTER — Other Ambulatory Visit (INDEPENDENT_AMBULATORY_CARE_PROVIDER_SITE_OTHER): Payer: No Typology Code available for payment source

## 2016-06-17 DIAGNOSIS — Z8249 Family history of ischemic heart disease and other diseases of the circulatory system: Secondary | ICD-10-CM

## 2016-06-17 DIAGNOSIS — Z0000 Encounter for general adult medical examination without abnormal findings: Secondary | ICD-10-CM

## 2016-06-17 DIAGNOSIS — Z833 Family history of diabetes mellitus: Secondary | ICD-10-CM

## 2016-06-17 DIAGNOSIS — R5383 Other fatigue: Secondary | ICD-10-CM

## 2016-06-18 ENCOUNTER — Emergency Department (HOSPITAL_COMMUNITY)
Admission: EM | Admit: 2016-06-18 | Discharge: 2016-06-18 | Disposition: A | Discharge status: Home / Self Care | Payer: No Typology Code available for payment source | Attending: Emergency Medicine | Admitting: Emergency Medicine

## 2016-06-18 ENCOUNTER — Encounter (HOSPITAL_COMMUNITY): Payer: Self-pay | Admitting: Emergency Medicine

## 2016-06-18 DIAGNOSIS — D509 Iron deficiency anemia, unspecified: Secondary | ICD-10-CM | POA: Insufficient documentation

## 2016-06-18 DIAGNOSIS — Z79899 Other long term (current) drug therapy: Secondary | ICD-10-CM | POA: Insufficient documentation

## 2016-06-18 DIAGNOSIS — F17210 Nicotine dependence, cigarettes, uncomplicated: Secondary | ICD-10-CM | POA: Diagnosis not present

## 2016-06-18 DIAGNOSIS — R718 Other abnormality of red blood cells: Secondary | ICD-10-CM | POA: Diagnosis present

## 2016-06-18 LAB — CBC WITH DIFFERENTIAL/PLATELET
BASOS ABS: 0 10*3/uL (ref 0.0–0.2)
BASOS ABS: 0 10*3/uL (ref 0.0–0.1)
Basophils Relative: 0 %
Basos: 0 %
EOS (ABSOLUTE): 0.1 10*3/uL (ref 0.0–0.4)
Eos: 2 %
Eosinophils Absolute: 0.1 10*3/uL (ref 0.0–0.7)
Eosinophils Relative: 1 %
HEMATOCRIT: 23.1 % — AB (ref 36.0–46.0)
HEMOGLOBIN: 6.4 g/dL — AB (ref 11.1–15.9)
HEMOGLOBIN: 6.1 g/dL — AB (ref 12.0–15.0)
Hematocrit: 23.9 % — ABNORMAL LOW (ref 34.0–46.6)
IMMATURE GRANS (ABS): 0 10*3/uL (ref 0.0–0.1)
IMMATURE GRANULOCYTES: 0 %
LYMPHS PCT: 38 %
LYMPHS: 38 %
Lymphocytes Absolute: 1.3 10*3/uL (ref 0.7–3.1)
Lymphs Abs: 1.9 10*3/uL (ref 0.7–4.0)
MCH: 14.7 pg — ABNORMAL LOW (ref 26.6–33.0)
MCH: 14.1 pg — ABNORMAL LOW (ref 26.0–34.0)
MCHC: 26.8 g/dL — ABNORMAL LOW (ref 31.5–35.7)
MCHC: 26.4 g/dL — AB (ref 30.0–36.0)
MCV: 55 fL — ABNORMAL LOW (ref 79–97)
MCV: 53.5 fL — ABNORMAL LOW (ref 78.0–100.0)
MONOCYTES: 11 %
MONOS PCT: 8 %
Monocytes Absolute: 0.4 10*3/uL (ref 0.1–0.9)
Monocytes Absolute: 0.4 10*3/uL (ref 0.1–1.0)
NEUTROS ABS: 2.6 10*3/uL (ref 1.7–7.7)
NEUTROS PCT: 49 %
Neutrophils Absolute: 1.6 10*3/uL (ref 1.4–7.0)
Neutrophils Relative %: 53 %
Platelets: 559 10*3/uL — ABNORMAL HIGH (ref 150–379)
Platelets: 479 10*3/uL — ABNORMAL HIGH (ref 150–400)
RBC: 4.35 x10E6/uL (ref 3.77–5.28)
RBC: 4.32 MIL/uL (ref 3.87–5.11)
RDW: 20.2 % — ABNORMAL HIGH (ref 12.3–15.4)
RDW: 20 % — ABNORMAL HIGH (ref 11.5–15.5)
WBC: 3.4 10*3/uL (ref 3.4–10.8)
WBC: 5 10*3/uL (ref 4.0–10.5)

## 2016-06-18 LAB — ABO/RH: ABO/RH(D): A POS

## 2016-06-18 LAB — LIPID PANEL
CHOL/HDL RATIO: 1.9 ratio (ref 0.0–4.4)
CHOLESTEROL TOTAL: 121 mg/dL (ref 100–199)
HDL: 64 mg/dL (ref >39)
LDL Calculated: 47 mg/dL (ref 0–99)
TRIGLYCERIDES: 50 mg/dL (ref 0–149)
VLDL Cholesterol Cal: 10 mg/dL (ref 5–40)

## 2016-06-18 LAB — IRON AND TIBC
IRON: 6 ug/dL — AB (ref 28–170)
SATURATION RATIOS: 1 % — AB (ref 10.4–31.8)
TIBC: 505 ug/dL — ABNORMAL HIGH (ref 250–450)
UIBC: 499 ug/dL

## 2016-06-18 LAB — VITAMIN D 25 HYDROXY (VIT D DEFICIENCY, FRACTURES): Vit D, 25-Hydroxy: 23.5 ng/mL — ABNORMAL LOW (ref 30.0–100.0)

## 2016-06-18 LAB — HEMOGLOBIN A1C
ESTIMATED AVERAGE GLUCOSE: 100 mg/dL
HEMOGLOBIN A1C: 5.1 % (ref 4.8–5.6)

## 2016-06-18 LAB — RETICULOCYTES
RBC.: 4.32 MIL/uL (ref 3.87–5.11)
Retic Count, Absolute: 43.2 10*3/uL (ref 19.0–186.0)
Retic Ct Pct: 1 % (ref 0.4–3.1)

## 2016-06-18 LAB — COMPREHENSIVE METABOLIC PANEL
ALK PHOS: 48 IU/L (ref 39–117)
ALT: 11 IU/L (ref 0–32)
AST: 16 IU/L (ref 0–40)
Albumin/Globulin Ratio: 1.4 (ref 1.2–2.2)
Albumin: 4.3 g/dL (ref 3.5–5.5)
BUN/Creatinine Ratio: 7 — ABNORMAL LOW (ref 9–23)
BUN: 5 mg/dL — ABNORMAL LOW (ref 6–20)
Bilirubin Total: 0.4 mg/dL (ref 0.0–1.2)
CALCIUM: 9.5 mg/dL (ref 8.7–10.2)
CO2: 23 mmol/L (ref 18–29)
CREATININE: 0.71 mg/dL (ref 0.57–1.00)
Chloride: 100 mmol/L (ref 96–106)
GFR calc Af Amer: 129 mL/min/{1.73_m2} (ref >59)
GFR, EST NON AFRICAN AMERICAN: 112 mL/min/{1.73_m2} (ref >59)
Globulin, Total: 3 g/dL (ref 1.5–4.5)
Glucose: 79 mg/dL (ref 65–99)
POTASSIUM: 4.7 mmol/L (ref 3.5–5.2)
Sodium: 139 mmol/L (ref 134–144)
Total Protein: 7.3 g/dL (ref 6.0–8.5)

## 2016-06-18 LAB — BASIC METABOLIC PANEL
ANION GAP: 8 (ref 5–15)
BUN: 8 mg/dL (ref 6–20)
CHLORIDE: 104 mmol/L (ref 101–111)
CO2: 26 mmol/L (ref 22–32)
Calcium: 9.2 mg/dL (ref 8.9–10.3)
Creatinine, Ser: 0.68 mg/dL (ref 0.44–1.00)
GFR calc non Af Amer: >60 mL/min (ref >60)
Glucose, Bld: 86 mg/dL (ref 65–99)
POTASSIUM: 4.1 mmol/L (ref 3.5–5.1)
Sodium: 138 mmol/L (ref 135–145)

## 2016-06-18 LAB — PREGNANCY, URINE: PREG TEST UR: NEGATIVE

## 2016-06-18 LAB — POC OCCULT BLOOD, ED: Fecal Occult Bld: NEGATIVE

## 2016-06-18 LAB — TSH: TSH: 0.837 u[IU]/mL (ref 0.450–4.500)

## 2016-06-18 LAB — PREPARE RBC (CROSSMATCH)

## 2016-06-18 LAB — VITAMIN B12: Vitamin B-12: 344 pg/mL (ref 180–914)

## 2016-06-18 LAB — FERRITIN: Ferritin: 2 ng/mL — ABNORMAL LOW (ref 11–307)

## 2016-06-18 LAB — FOLATE: FOLATE: 29.1 ng/mL (ref >5.9)

## 2016-06-18 MED ORDER — FERROUS SULFATE 325 (65 FE) MG PO TABS: 325.0000 mg | ORAL_TABLET | Freq: Every day | ORAL | 0 refills | Status: AC

## 2016-06-18 MED ORDER — SODIUM CHLORIDE 0.9 % IV SOLN
10.0000 mL/h | Freq: Once | INTRAVENOUS | Status: AC
  Administered 2016-06-18: 10 mL/h via INTRAVENOUS

## 2016-06-18 NOTE — ED Notes (Signed)
Per Dr. Lynelle Doctor, blood needs to be transfused over 2 hours.

## 2016-06-18 NOTE — Discharge Instructions (Signed)
Follow up with your primary care doctor or the hematology office regarding your anemia

## 2016-06-18 NOTE — ED Provider Notes (Signed)
[facility]-EMERGENCY DEPT Provider Note   CSN: [id number] Arrival date & time: 06/18/16  1022     History   Chief Complaint Chief Complaint  Patient presents with  . Abnormal Lab    HPI Briana Stout is a 36 y.o. female.  HPI Patient presents to the emergency room for evaluation of a low hemoglobin. Patient went to her primary care doctor on March 26. She had outpatient laboratory tests drawn the other day and was called today and told that her blood count was very low and she needed to come to the emergency room. She was told she may need a blood transfusion. She denies any active bleeding. She denies any blood in her stool. No dark stools. She does have menstrual periods regularly.  The first couple days may be a little heavy but she does not think she has very heavy menstrual periods.  Patient states this was the first visit to see a primary care doctor in a while. She denies having any symptoms. She does not have any weakness. She does not have any fatigue. Past Medical History:  Diagnosis Date  . GERD (gastroesophageal reflux disease)     Patient Active Problem List   Diagnosis Date Noted  . Routine physical examination 06/09/2016  . Family history of heart disease 06/09/2016  . Other fatigue 06/09/2016  . Family history of diabetes mellitus 06/09/2016  . Tobacco use 06/09/2016    Past Surgical History:  Procedure Laterality Date  . TUBAL LIGATION Bilateral 07/2004  . WISDOM TOOTH EXTRACTION      OB History    No data available       Home Medications    Prior to Admission medications   Medication Sig Start Date End Date Taking? Authorizing Provider  buPROPion (WELLBUTRIN SR) 150 MG 12 hr tablet Take 1 tablet (150 mg total) by mouth 2 (two) times daily. First three days please take just one tablet, then increase to two tablets daily (every 12 hrs). Patient taking differently: Take 150 mg by mouth 2 (two) times daily.  06/09/16  Yes Malon Kindle, NP  meloxicam (MOBIC)  7.5 MG tablet Take 1 tablet (7.5 mg total) by mouth daily. May take at bedtime is needed for more pain control. Patient taking differently: Take 7.5 mg by mouth daily.  06/09/16  Yes Malon Kindle, NP  ferrous sulfate 325 (65 FE) MG tablet Take 1 tablet (325 mg total) by mouth daily. 06/18/16   Linwood Dibbles, MD    Family History Family History  Problem Relation Age of Onset  . Cancer Mother     lung- in remission  . Heart attack Father     mid 64's  . Early death Father   . Healthy Sister   . Healthy Brother   . Healthy Daughter   . Healthy Son   . Healthy Sister   . Healthy Sister     Social History Social History  Substance Use Topics  . Smoking status: Current Every Day Smoker    Packs/day: 0.50    Years: 15.00    Types: Cigarettes  . Smokeless tobacco: Never Used  . Alcohol use Yes     Allergies   Patient has no known allergies.   Review of Systems Review of Systems  All other systems reviewed and are negative.    Physical Exam Updated Vital Signs BP (!) 142/86   Pulse 66   Temp 98.5 F (36.9 C) (Oral)   Resp 16  Ht  (1.702 m)   Wt 68.9 kg   LMP 06/05/2016 (Exact Date)   SpO2 100%   BMI 23.81 kg/m   Physical Exam  Constitutional: She appears well-developed and well-nourished. No distress.  HENT:  Head: Normocephalic and atraumatic.  Right Ear: External ear normal.  Left Ear: External ear normal.  Eyes: Conjunctivae are normal. Right eye exhibits no discharge. Left eye exhibits no discharge. No scleral icterus.  Neck: Neck supple. No tracheal deviation present.  Cardiovascular: Normal rate, regular rhythm and intact distal pulses.   Pulmonary/Chest: Effort normal and breath sounds normal. No stridor. No respiratory distress. She has no wheezes. She has no rales.  Abdominal: Soft. Bowel sounds are normal. She exhibits no distension. There is no tenderness. There is no rebound and no guarding.  Musculoskeletal: She exhibits no edema or tenderness.    Neurological: She is alert. She has normal strength. No cranial nerve deficit (no facial droop, extraocular movements intact, no slurred speech) or sensory deficit. She exhibits normal muscle tone. She displays no seizure activity. Coordination normal.  Skin: Skin is warm and dry. No rash noted.  Psychiatric: She has a normal mood and affect.  Nursing note and vitals reviewed.    ED Treatments / Results  Labs (all labs ordered are listed, but only abnormal results are displayed) Labs Reviewed  CBC WITH DIFFERENTIAL/PLATELET - Abnormal; Notable for the following:       Result Value   Hemoglobin 6.1 (*)    HCT 23.1 (*)    MCV 53.5 (*)    MCH 14.1 (*)    MCHC 26.4 (*)    RDW 20.0 (*)    Platelets 479 (*)    All other components within normal limits  RETICULOCYTES  BASIC METABOLIC PANEL  PREGNANCY, URINE  VITAMIN B12  FOLATE  IRON AND TIBC  FERRITIN  POC OCCULT BLOOD, ED  TYPE AND SCREEN  ABO/RH  PREPARE RBC (CROSSMATCH)     Procedures Procedures (including critical care time)  Medications Ordered in ED Medications  0.9 %  sodium chloride infusion (not administered)     Initial Impression / Assessment and Plan / ED Course  I have reviewed the triage vital signs and the nursing notes.  Pertinent labs & imaging results that were available during my care of the patient were reviewed by me and considered in my medical decision making (see chart for details).  Clinical Course as of Jun 18 1401  Wed Jun 18, 2016  1320 Labs do confirm a microcytic anemia.  Likely iron deficiency.  NO sign of GI bleeding.  Pt denies heavy menses but most likely the etiology.  Will consult with hematology  [JK]  1354 Discussed findings with Dr. Shirline Frees. He agrees this most likely is an iron deficiency anemia. At her degree of anemia , he does recommend blood transfusion. I discussed this with the patient. We will transfuse 2 units of packed RBCs.  [JK]    Clinical Course User Index [JK]  Linwood Dibbles, MD   Patient's laboratory tests are notable for a microcytic anemia. Guaiac test is negative. She does not have any evidence of active bleeding. No evidence to suggest hemolysis.  Suspect her symptoms are related to a 9 deficiency anemia. She is otherwise stable. We will go ahead and transfuse her 2 units of packed RBCs here in the emergency room and plan on discharge with outpatient follow-up following the transfusion.   Final Clinical Impressions(s) / ED Diagnoses   Final  diagnoses:  Microcytic anemia    New Prescriptions New Prescriptions   FERROUS SULFATE 325 (65 FE) MG TABLET    Take 1 tablet (325 mg total) by mouth daily.     Linwood Dibbles, MD 06/18/16 [phone]

## 2016-06-18 NOTE — ED Provider Notes (Signed)
Patient tolerated blood transfusion the 2 units without any difficulty. Feeling much better. Patient will be discharged home on iron and follow-up with her primary care doctor.   Vanetta Mulders, MD 06/18/16 2111

## 2016-06-18 NOTE — ED Triage Notes (Signed)
Patient had blood drawn. PCP sent patient here today due to a low Hgb. Patient unsure of actual value. PCP told patient she may need blood transfusion. Patient denies noticing any abnormal bleeding.

## 2016-06-19 LAB — TYPE AND SCREEN
ABO/RH(D): A POS
Antibody Screen: NEGATIVE
Unit division: 0
Unit division: 0

## 2016-06-19 LAB — BPAM RBC
BLOOD PRODUCT EXPIRATION DATE: 201804222359
Blood Product Expiration Date: 201804232359
ISSUE DATE / TIME: 201804041516
ISSUE DATE / TIME: 201804041805
UNIT TYPE AND RH: 6200
UNIT TYPE AND RH: 6200
